# Patient Record
Sex: Female | Born: 1960 | Race: White | Hispanic: No | Marital: Married | Smoking: Former smoker
Health system: Southern US, Community
[De-identification: ages and names within clinical notes are randomized; demographics above are authoritative.]

## PROBLEM LIST (undated history)

## (undated) DIAGNOSIS — I2510 Atherosclerotic heart disease of native coronary artery without angina pectoris: Secondary | ICD-10-CM

## (undated) DIAGNOSIS — G43909 Migraine, unspecified, not intractable, without status migrainosus: Secondary | ICD-10-CM

## (undated) DIAGNOSIS — R569 Unspecified convulsions: Secondary | ICD-10-CM

## (undated) DIAGNOSIS — I10 Essential (primary) hypertension: Secondary | ICD-10-CM

## (undated) DIAGNOSIS — E785 Hyperlipidemia, unspecified: Secondary | ICD-10-CM

## (undated) HISTORY — PX: BARIATRIC SURGERY: SHX1103

---

## 2019-04-22 ENCOUNTER — Encounter: Payer: Self-pay | Admitting: Emergency Medicine

## 2019-04-22 ENCOUNTER — Other Ambulatory Visit: Payer: Self-pay

## 2019-04-22 ENCOUNTER — Emergency Department
Admission: EM | Admit: 2019-04-22 | Discharge: 2019-04-22 | Disposition: A | Discharge status: Home / Self Care | Payer: Medicare Other | Source: Home / Self Care

## 2019-04-22 DIAGNOSIS — H00025 Hordeolum internum left lower eyelid: Secondary | ICD-10-CM | POA: Diagnosis not present

## 2019-04-22 DIAGNOSIS — L03211 Cellulitis of face: Secondary | ICD-10-CM

## 2019-04-22 MED ORDER — ERYTHROMYCIN 5 MG/GM OP OINT: TOPICAL_OINTMENT | Freq: Four times a day (QID) | OPHTHALMIC | 0 refills | Status: DC

## 2019-04-22 MED ORDER — CEPHALEXIN 500 MG PO CAPS: 500.0000 mg | ORAL_CAPSULE | Freq: Two times a day (BID) | ORAL | 0 refills | Status: DC

## 2019-04-22 NOTE — ED Triage Notes (Signed)
Patient c/o left eye swelling under eye, very painful and redness.  No injury.

## 2019-04-22 NOTE — ED Provider Notes (Signed)
[facility]    CSN: [id number] Arrival date & time: 04/22/19  1009      History   Chief Complaint Chief Complaint  Patient presents with  . Facial Swelling    HPI Taylor Welch is a 58 y.o. female.   HPI Taylor Welch is a 58 y.o. female presenting to UC with c/o 2 days of worsening Left lower eyelid pain, redness and swelling that has progressed into Left cheek below her eye. She initially thought symptoms were due to a stye, she has not had  Stye in the past but tried some OTC "stye' medication w/o relief.  Associated frontal headache. Denies fever, chills, n/v/d. Denies ear pain or sore throat.    History reviewed. No pertinent past medical history.  There are no active problems to display for this patient.   History reviewed. No pertinent surgical history.  OB History   No obstetric history on file.      Home Medications    Prior to Admission medications   Medication Sig Start Date End Date Taking? Authorizing Provider  albuterol (PROAIR HFA) 108 (90 Base) MCG/ACT inhaler ProAir HFA 90 mcg/actuation aerosol inhaler  Inhale two puffs into the lungs every 6 (six) hours as needed for Wheezing. 08/02/17  Yes [provider]  Azilsartan Medoxomil 40 MG TABS Take by mouth. 04/11/19  Yes [provider]  carbamazepine (TEGRETOL XR) 200 MG 12 hr tablet carbamazepine ER 200 mg tablet,extended release,12 hr  Take 3 tablets (600 mg total) by mouth 2 times daily. As directed for seizure control 01/16/19  Yes [provider]  clopidogrel (PLAVIX) 75 MG tablet clopidogrel 75 mg tablet  TAKE ONE TABLET BY MOUTH EVERY DAY 03/28/19  Yes [provider]  cycloSPORINE (RESTASIS) 0.05 % ophthalmic emulsion Restasis 0.05 % eye drops in a dropperette   Yes [provider]  Diclofenac Potassium (CAMBIA) 50 MG PACK Cambia 50 mg oral powder packet  Take 1 packet by mouth as needed. For migraine 10/22/14  Yes [provider]   dicyclomine (BENTYL) 10 MG capsule dicyclomine 10 mg capsule   Yes [provider]  estradiol (ESTRACE) 0.1 MG/GM vaginal cream estradiol 0.01% (0.1 mg/gram) vaginal cream 10/29/16  Yes [provider]  ezetimibe (ZETIA) 10 MG tablet ezetimibe 10 mg tablet  TAKE 1 TABLET BY MOUTH AFTER BREAKFAST AS DIRECTED 03/17/19  Yes [provider]  fluticasone (FLONASE) 50 MCG/ACT nasal spray fluticasone propionate 50 mcg/actuation nasal spray,suspension 08/02/16  Yes [provider]  meclizine (ANTIVERT) 25 MG tablet meclizine 25 mg tablet  TAKE ONE TABLET BY MOUTH 3 TIMES A DAY AS NEEDED FOR DIZZINESS 01/13/18  Yes [provider]  mometasone-formoterol (DULERA) 200-5 MCG/ACT AERO Inhale into the lungs. 08/26/17  Yes [provider]  montelukast (SINGULAIR) 10 MG tablet montelukast 10 mg tablet  Take one tablet (10 mg dose) by mouth at bedtime. 09/02/17  Yes [provider]  omeprazole (PRILOSEC) 40 MG capsule Take one capsule by mouth night before surgery (between 9 and 11pm) then once daily 12/21/18  Yes [provider]  ondansetron (ZOFRAN-ODT) 4 MG disintegrating tablet ondansetron 4 mg disintegrating tablet 01/01/17  Yes [provider]  potassium chloride (K-DUR) 10 MEQ tablet potassium chloride ER 10 mEq tablet,extended release(part/cryst)  TAKE TWO TABLETS BY MOUTH 2 TIMES A DAY. 09/21/17  Yes [provider]  rosuvastatin (CRESTOR) 5 MG tablet rosuvastatin 5 mg tablet  TAKE ONE TABLET BY MOUTH EVERY DAY 11/01/13  Yes [provider]  tobramycin-dexamethasone (TOBRADEX) ophthalmic solution tobramycin 0.3 %-dexamethasone 0.1 % eye drops,suspension  Instill 1 drop into both eyes as directed   Yes [provider]  torsemide (DEMADEX) 20 MG tablet torsemide 20 mg tablet 11/17/13  Yes [provider]  cephALEXin (KEFLEX) 500 MG capsule Take 1 capsule (500 mg total) by mouth 2 (two) times daily.  04/22/19   Lurene Shadow, PA-C  erythromycin ophthalmic ointment Place into the left eye 4 (four) times daily. Place a 1/2 inch ribbon of ointment into the lower eyelid. 04/22/19   Lurene Shadow, PA-C    Family History No family history on file.  Social History Social History   Tobacco Use  . Smoking status: Former Games developer  . Smokeless tobacco: Never Used  Substance Use Topics  . Alcohol use: Not on file  . Drug use: Not on file     Allergies   Amoxicillin-pot clavulanate, Atorvastatin calcium, Fluocinolone, Nitrofurantoin macrocrystal, Propoxyphene, Simvastatin, Ciprofloxacin, Meperidine, and Doxycycline calcium   Review of Systems Review of Systems  Constitutional: Negative for chills and fever.  HENT: Positive for sinus pressure and sinus pain (Left maxillary). Negative for congestion and rhinorrhea.   Eyes: Positive for discharge and redness. Negative for photophobia, pain and visual disturbance.       Left lower eyelid  Skin: Positive for color change. Negative for wound.  Neurological: Positive for headaches. Negative for dizziness and light-headedness.     Physical Exam Triage Vital Signs ED Triage Vitals  Enc Vitals Group     BP 04/22/19 1036 (!) 178/89     Pulse Rate 04/22/19 1036 (!) 59     Resp --      Temp 04/22/19 1036 98.4 F (36.9 C)     Temp Source 04/22/19 1036 Oral     SpO2 04/22/19 1036 96 %     Weight 04/22/19 1037 192 lb (87.1 kg)     Height 04/22/19 1037 5' 5.5" (1.664 m)     Head Circumference --      Peak Flow --      Pain Score 04/22/19 1036 7     Pain Loc --      Pain Edu? --      Excl. in [place]? --    No data found.  Updated Vital Signs BP (!) 178/89 (BP Location: Right Arm)   Pulse (!) 59   Temp 98.4 F (36.9 C) (Oral)   Ht 5' 5.5" (1.664 m)   Wt 192 lb (87.1 kg)   SpO2 96%   BMI 31.46 kg/m   Physical Exam Vitals signs and nursing note reviewed.  Constitutional:      Appearance: She is well-developed.  HENT:     Head:  Normocephalic and atraumatic.      Nose: Nose normal.     Mouth/Throat:     Mouth: Mucous membranes are moist.  Eyes:     General: No scleral icterus.       Right eye: No discharge.        Left eye: No discharge.     Extraocular Movements: Extraocular movements intact.     Conjunctiva/sclera: Conjunctivae normal.     Pupils: Pupils are equal, round, and reactive to light.   Neck:     Musculoskeletal: Normal range of motion.  Cardiovascular:     Rate and Rhythm: Normal rate.  Pulmonary:     Effort: Pulmonary effort is normal.  Musculoskeletal: Normal range of motion.  Skin:  General: Skin is warm and dry.  Neurological:     Mental Status: She is alert and oriented to person, place, and time.  Psychiatric:        Behavior: Behavior normal.      UC Treatments / Results  Labs (all labs ordered are listed, but only abnormal results are displayed) Labs Reviewed - No data to display  EKG   Radiology No results found.  Procedures Procedures (including critical care time)  Medications Ordered in UC Medications - No data to display  Initial Impression / Assessment and Plan / UC Course  I have reviewed the triage vital signs and the nursing notes.  Pertinent labs & imaging results that were available during my care of the patient were reviewed by me and considered in my medical decision making (see chart for details).     Hx and exam c/w Left lower eyelid hordeolum with early cellulitis below Left eye. Pt has rash with PCNs but has had keflex in the past  AVS provided.  Final Clinical Impressions(s) / UC Diagnoses   Final diagnoses:  Hordeolum internum left lower eyelid  Cellulitis, face     Discharge Instructions      Please take antibiotics as prescribed and be sure to complete entire course even if you start to feel better to ensure infection does not come back.  Please follow up with a medical provider in 4-5 days if not improving, sooner if symptoms  continue to worsen despite antibiotics.      ED Prescriptions    Medication Sig Dispense Auth. Provider   cephALEXin (KEFLEX) 500 MG capsule Take 1 capsule (500 mg total) by mouth 2 (two) times daily. 14 capsule Leeroy Cha O, PA-C   erythromycin ophthalmic ointment Place into the left eye 4 (four) times daily. Place a 1/2 inch ribbon of ointment into the lower eyelid. 3.5 g Noe Gens, PA-C     PDMP not reviewed this encounter.   Noe Gens, PA-C 04/22/19 1106

## 2019-04-22 NOTE — Discharge Instructions (Signed)
°  Please take antibiotics as prescribed and be sure to complete entire course even if you start to feel better to ensure infection does not come back.  Please follow up with a medical provider in 4-5 days if not improving, sooner if symptoms continue to worsen despite antibiotics.

## 2019-08-03 ENCOUNTER — Emergency Department
Admission: EM | Admit: 2019-08-03 | Discharge: 2019-08-03 | Disposition: A | Discharge status: Home / Self Care | Payer: Medicare Other | Source: Home / Self Care

## 2019-08-03 ENCOUNTER — Other Ambulatory Visit: Payer: Self-pay

## 2019-08-03 ENCOUNTER — Encounter: Payer: Self-pay | Admitting: Emergency Medicine

## 2019-08-03 DIAGNOSIS — R300 Dysuria: Secondary | ICD-10-CM

## 2019-08-03 HISTORY — DX: Unspecified convulsions: R56.9

## 2019-08-03 HISTORY — DX: Migraine, unspecified, not intractable, without status migrainosus: G43.909

## 2019-08-03 HISTORY — DX: Hyperlipidemia, unspecified: E78.5

## 2019-08-03 HISTORY — DX: Essential (primary) hypertension: I10

## 2019-08-03 HISTORY — DX: Atherosclerotic heart disease of native coronary artery without angina pectoris: I25.10

## 2019-08-03 LAB — POCT URINALYSIS DIP (MANUAL ENTRY)
Bilirubin, UA: NEGATIVE
Blood, UA: NEGATIVE
Glucose, UA: NEGATIVE mg/dL
Ketones, POC UA: NEGATIVE mg/dL
Nitrite, UA: NEGATIVE
Protein Ur, POC: NEGATIVE mg/dL
Spec Grav, UA: 1.015
Urobilinogen, UA: 0.2 U/dL
pH, UA: 6.5

## 2019-08-03 MED ORDER — CEPHALEXIN 500 MG PO CAPS: 500.0000 mg | ORAL_CAPSULE | Freq: Four times a day (QID) | ORAL | 0 refills | Status: AC

## 2019-08-03 NOTE — Discharge Instructions (Addendum)
Return if any problems.

## 2019-08-03 NOTE — ED Triage Notes (Signed)
Dysuria x 2 days.

## 2019-08-07 LAB — UNLABELED: Test Ordered On Req: 395

## 2019-08-07 NOTE — ED Provider Notes (Signed)
[facility]    CSN: [id number] Arrival date & time: 08/03/19  1205      History   Chief Complaint Chief Complaint  Patient presents with  . Dysuria    HPI Taylor Welch is a 59 y.o. female.   The history is provided by the patient. No language interpreter was used.  Dysuria Pain quality:  Aching Pain severity:  Moderate Onset quality:  Gradual Timing:  Constant Progression:  Worsening Chronicity:  New Recent urinary tract infections: no   Relieved by:  Nothing Worsened by:  Nothing Ineffective treatments:  None tried Risk factors: no hx of pyelonephritis     Past Medical History:  Diagnosis Date  . Coronary artery disease   . Hyperlipemia   . Hypertension   . Migraines   . Seizures (HCC)     There are no problems to display for this patient.   Past Surgical History:  Procedure Laterality Date  . BARIATRIC SURGERY      OB History   No obstetric history on file.      Home Medications    Prior to Admission medications   Medication Sig Start Date End Date Taking? Authorizing Provider  albuterol (PROAIR HFA) 108 (90 Base) MCG/ACT inhaler ProAir HFA 90 mcg/actuation aerosol inhaler  Inhale two puffs into the lungs every 6 (six) hours as needed for Wheezing. 08/02/17   [provider]  carbamazepine (TEGRETOL XR) 200 MG 12 hr tablet carbamazepine ER 200 mg tablet,extended release,12 hr  Take 3 tablets (600 mg total) by mouth 2 times daily. As directed for seizure control 01/16/19   [provider]  cephALEXin (KEFLEX) 500 MG capsule Take 1 capsule (500 mg total) by mouth 4 (four) times daily for 7 days. 08/03/19 08/10/19  Elson Areas, PA-C  clopidogrel (PLAVIX) 75 MG tablet clopidogrel 75 mg tablet  TAKE ONE TABLET BY MOUTH EVERY DAY 03/28/19   [provider]  cycloSPORINE (RESTASIS) 0.05 % ophthalmic emulsion Restasis 0.05 % eye drops in a dropperette    [provider]  Diclofenac Potassium (CAMBIA) 50 MG  PACK Cambia 50 mg oral powder packet  Take 1 packet by mouth as needed. For migraine 10/22/14   [provider]  dicyclomine (BENTYL) 10 MG capsule dicyclomine 10 mg capsule    [provider]  erythromycin ophthalmic ointment Place into the left eye 4 (four) times daily. Place a 1/2 inch ribbon of ointment into the lower eyelid. 04/22/19   Lurene Shadow, PA-C  estradiol (ESTRACE) 0.1 MG/GM vaginal cream estradiol 0.01% (0.1 mg/gram) vaginal cream 10/29/16   [provider]  ezetimibe (ZETIA) 10 MG tablet ezetimibe 10 mg tablet  TAKE 1 TABLET BY MOUTH AFTER BREAKFAST AS DIRECTED 03/17/19   [provider]  fluticasone (FLONASE) 50 MCG/ACT nasal spray fluticasone propionate 50 mcg/actuation nasal spray,suspension 08/02/16   [provider]  meclizine (ANTIVERT) 25 MG tablet meclizine 25 mg tablet  TAKE ONE TABLET BY MOUTH 3 TIMES A DAY AS NEEDED FOR DIZZINESS 01/13/18   [provider]  mometasone-formoterol (DULERA) 200-5 MCG/ACT AERO Inhale into the lungs. 08/26/17   [provider]  montelukast (SINGULAIR) 10 MG tablet montelukast 10 mg tablet  Take one tablet (10 mg dose) by mouth at bedtime. 09/02/17   [provider]  omeprazole (PRILOSEC) 40 MG capsule Take one capsule by mouth night before surgery (between 9 and 11pm) then once daily 12/21/18   [provider]  ondansetron (ZOFRAN-ODT) 4 MG disintegrating  tablet ondansetron 4 mg disintegrating tablet 01/01/17   [provider]  rosuvastatin (CRESTOR) 5 MG tablet rosuvastatin 5 mg tablet  TAKE ONE TABLET BY MOUTH EVERY DAY 11/01/13   [provider]  tobramycin-dexamethasone (TOBRADEX) ophthalmic solution tobramycin 0.3 %-dexamethasone 0.1 % eye drops,suspension  Instill 1 drop into both eyes as directed    [provider]  torsemide (DEMADEX) 20 MG tablet torsemide 20 mg tablet 11/17/13   [provider]    Family History Family  History  Problem Relation Age of Onset  . Heart failure Mother   . Hypertension Mother   . Heart failure Father     Social History Social History   Tobacco Use  . Smoking status: Former Smoker    Quit date: 08/02/2012    Years since quitting: 7.0  . Smokeless tobacco: Never Used  Substance Use Topics  . Alcohol use: Yes  . Drug use: Not on file     Allergies   Amoxicillin-pot clavulanate, Atorvastatin calcium, Fluocinolone, Nitrofurantoin macrocrystal, Propoxyphene, Simvastatin, Ciprofloxacin, Meperidine, and Doxycycline calcium   Review of Systems Review of Systems  Genitourinary: Positive for dysuria.  All other systems reviewed and are negative.    Physical Exam Triage Vital Signs ED Triage Vitals  Enc Vitals Group     BP 08/03/19 1255 (!) 146/79     Pulse Rate 08/03/19 1255 70     Resp --      Temp 08/03/19 1255 98.6 F (37 C)     Temp Source 08/03/19 1255 Oral     SpO2 08/03/19 1255 98 %     Weight 08/03/19 1256 185 lb (83.9 kg)     Height 08/03/19 1256 5\' 5"  (1.651 m)     Head Circumference --      Peak Flow --      Pain Score 08/03/19 1256 7     Pain Loc --      Pain Edu? --      Excl. in [place]? --    No data found.  Updated Vital Signs BP (!) 146/79 (BP Location: Right Arm)   Pulse 70   Temp 98.6 F (37 C) (Oral)   Ht 5\' 5"  (1.651 m)   Wt 83.9 kg   SpO2 98%   BMI 30.79 kg/m   Visual Acuity Right Eye Distance:   Left Eye Distance:   Bilateral Distance:    Right Eye Near:   Left Eye Near:    Bilateral Near:     Physical Exam Vitals and nursing note reviewed.  Constitutional:      Appearance: She is well-developed.  HENT:     Head: Normocephalic.  Cardiovascular:     Rate and Rhythm: Normal rate.  Pulmonary:     Effort: Pulmonary effort is normal.  Abdominal:     General: There is no distension.  Musculoskeletal:        General: Normal range of motion.     Cervical back: Normal range of motion.  Neurological:     Mental  Status: She is alert and oriented to person, place, and time.      UC Treatments / Results  Labs (all labs ordered are listed, but only abnormal results are displayed) Labs Reviewed  POCT URINALYSIS DIP (MANUAL ENTRY) - Abnormal; Notable for the following components:      Result Value   Leukocytes, UA Trace (*)    All other components within normal limits  URINE CULTURE    EKG  Radiology No results found.  Procedures Procedures (including critical care time)  Medications Ordered in UC Medications - No data to display  Initial Impression / Assessment and Plan / UC Course  I have reviewed the triage vital signs and the nursing notes.  Pertinent labs & imaging results that were available during my care of the patient were reviewed by me and considered in my medical decision making (see chart for details).     MDM  I will culture urine.  Pt given rx for keflex.  Final Clinical Impressions(s) / UC Diagnoses   Final diagnoses:  Dysuria     Discharge Instructions     Return if any problems.   ED Prescriptions    Medication Sig Dispense Auth. Provider   cephALEXin (KEFLEX) 500 MG capsule Take 1 capsule (500 mg total) by mouth 4 (four) times daily for 7 days. 28 capsule Elson Areas, [place]     PDMP not reviewed this encounter.  An After Visit Summary was printed and given to the patient.    Elson Areas, [place] 08/07/19 1020

## 2019-08-09 LAB — PAT ID TIQ DOC: Test Affected: 395

## 2019-08-09 LAB — URINE CULTURE
MICRO NUMBER:: 1245210
SPECIMEN QUALITY:: ADEQUATE

## 2020-11-09 ENCOUNTER — Other Ambulatory Visit: Payer: Self-pay

## 2020-11-09 ENCOUNTER — Emergency Department (INDEPENDENT_AMBULATORY_CARE_PROVIDER_SITE_OTHER): Payer: Medicare Other

## 2020-11-09 ENCOUNTER — Emergency Department
Admission: EM | Admit: 2020-11-09 | Discharge: 2020-11-09 | Disposition: A | Discharge status: Home / Self Care | Payer: Medicare Other | Source: Home / Self Care | Attending: Family Medicine | Admitting: Family Medicine

## 2020-11-09 DIAGNOSIS — M79672 Pain in left foot: Secondary | ICD-10-CM | POA: Diagnosis not present

## 2020-11-09 DIAGNOSIS — M7742 Metatarsalgia, left foot: Secondary | ICD-10-CM

## 2020-11-09 DIAGNOSIS — M67972 Unspecified disorder of synovium and tendon, left ankle and foot: Secondary | ICD-10-CM

## 2020-11-09 NOTE — Discharge Instructions (Signed)
Wear boot at all times that you are walking May try ice and elevation to reduce swelling and pain Take Tylenol for pain See podiatrist in May

## 2020-11-09 NOTE — ED Provider Notes (Signed)
[facility]    CSN: [id number] Arrival date & time: 11/09/20  1344      History   Chief Complaint Chief Complaint  Patient presents with  . Foot Pain    Left    HPI Taylor Welch is a 60 y.o. female.   HPI  Patient is here for foot pain.  She is having significant pain in her left foot.  It is making painful for her to walk.  She states she is walking with her foot rotated outward and putting all of her weight on her heel.  This is not comfortable either, she has chronic plantar fasciitis and heel pain.  She was referred to podiatry by her usual physician.  The appointment is not until early May.  Patient does not feel like she can wait this long for pain relief.  Current pain is across the toes and distal foot.  It is swollen.  She has not taken medicine for the pain. She has known vascular disease. Previously was a smoker but is not now. Is on medicine controlling her hypertension.  Past Medical History:  Diagnosis Date  . Coronary artery disease   . Hyperlipemia   . Hypertension   . Migraines   . Seizures (HCC)     There are no problems to display for this patient.   Past Surgical History:  Procedure Laterality Date  . BARIATRIC SURGERY      OB History   No obstetric history on file.      Home Medications    Prior to Admission medications   Medication Sig Start Date End Date Taking? Authorizing Provider  amlodipine-atorvastatin (CADUET) 10-10 MG tablet Take 1 tablet by mouth daily.   Yes [provider]  Azilsartan Medoxomil 80 MG TABS Take by mouth.   Yes [provider]  DULoxetine (CYMBALTA) 60 MG capsule Take 1 capsule by mouth daily. 09/20/13  Yes [provider]  omeprazole (PRILOSEC) 40 MG capsule Take by mouth. 02/02/20  Yes [provider]  torsemide (DEMADEX) 20 MG tablet Take by mouth. 04/01/20 11/09/20 Yes [provider]  albuterol (PROAIR HFA) 108 (90 Base) MCG/ACT inhaler ProAir HFA 90  mcg/actuation aerosol inhaler  Inhale two puffs into the lungs every 6 (six) hours as needed for Wheezing. 08/02/17   [provider]  carbamazepine (TEGRETOL XR) 200 MG 12 hr tablet carbamazepine ER 200 mg tablet,extended release,12 hr  Take 3 tablets (600 mg total) by mouth 2 times daily. As directed for seizure control 01/16/19   [provider]  clopidogrel (PLAVIX) 75 MG tablet clopidogrel 75 mg tablet  TAKE ONE TABLET BY MOUTH EVERY DAY 03/28/19   [provider]  cycloSPORINE (RESTASIS) 0.05 % ophthalmic emulsion Restasis 0.05 % eye drops in a dropperette    [provider]  dicyclomine (BENTYL) 10 MG capsule dicyclomine 10 mg capsule    [provider]  estradiol (ESTRACE) 0.1 MG/GM vaginal cream estradiol 0.01% (0.1 mg/gram) vaginal cream 10/29/16   [provider]  ezetimibe (ZETIA) 10 MG tablet ezetimibe 10 mg tablet  TAKE 1 TABLET BY MOUTH AFTER BREAKFAST AS DIRECTED 03/17/19   [provider]  fluticasone (FLONASE) 50 MCG/ACT nasal spray fluticasone propionate 50 mcg/actuation nasal spray,suspension 08/02/16   [provider]  meclizine (ANTIVERT) 25 MG tablet meclizine 25 mg tablet  TAKE ONE TABLET BY MOUTH 3 TIMES A DAY AS NEEDED FOR DIZZINESS 01/13/18   [provider]  mometasone-formoterol (DULERA) 200-5 MCG/ACT AERO Inhale  into the lungs. 08/26/17   [provider]  montelukast (SINGULAIR) 10 MG tablet montelukast 10 mg tablet  Take one tablet (10 mg dose) by mouth at bedtime. 09/02/17   [provider]  ondansetron (ZOFRAN-ODT) 4 MG disintegrating tablet ondansetron 4 mg disintegrating tablet 01/01/17   [provider]  rosuvastatin (CRESTOR) 5 MG tablet rosuvastatin 5 mg tablet  TAKE ONE TABLET BY MOUTH EVERY DAY 11/01/13   [provider]  tobramycin-dexamethasone (TOBRADEX) ophthalmic solution tobramycin 0.3 %-dexamethasone 0.1 % eye drops,suspension  Instill 1 drop  into both eyes as directed    [provider]    Family History Family History  Problem Relation Age of Onset  . Heart failure Mother   . Hypertension Mother   . Heart failure Father     Social History Social History   Tobacco Use  . Smoking status: Former Smoker    Quit date: 08/02/2012    Years since quitting: 8.2  . Smokeless tobacco: Never Used  Vaping Use  . Vaping Use: Former  Substance Use Topics  . Alcohol use: Not Currently  . Drug use: Not Currently     Allergies   Amoxicillin-pot clavulanate, Atorvastatin calcium, Fluocinolone, Nitrofurantoin macrocrystal, Propoxyphene, Simvastatin, Ciprofloxacin, Meperidine, and Doxycycline calcium   Review of Systems Review of Systems See HPI  Physical Exam Triage Vital Signs ED Triage Vitals  Enc Vitals Group     BP 11/09/20 1400 (!) 148/83     Pulse Rate 11/09/20 1400 65     Resp 11/09/20 1400 20     Temp 11/09/20 1400 98.4 F (36.9 C)     Temp Source 11/09/20 1400 Oral     SpO2 11/09/20 1400 98 %     Weight 11/09/20 1358 185 lb (83.9 kg)     Height 11/09/20 1358 5' 5.5" (1.664 m)     Head Circumference --      Peak Flow --      Pain Score 11/09/20 1358 7     Pain Loc --      Pain Edu? --      Excl. in [place]? --    No data found.  Updated Vital Signs BP (!) 148/83 (BP Location: Right Arm)   Pulse 65   Temp 98.4 F (36.9 C) (Oral)   Resp 20   Ht 5' 5.5" (1.664 m)   Wt 83.9 kg   SpO2 98%   BMI 30.32 kg/m        Physical Exam Constitutional:      General: She is not in acute distress.    Appearance: She is well-developed.  HENT:     Head: Normocephalic and atraumatic.     Mouth/Throat:     Comments: Mask in place Eyes:     Conjunctiva/sclera: Conjunctivae normal.     Pupils: Pupils are equal, round, and reactive to light.  Cardiovascular:     Rate and Rhythm: Normal rate.  Pulmonary:     Effort: Pulmonary effort is normal. No respiratory distress.  Abdominal:     General: There  is no distension.     Palpations: Abdomen is soft.  Musculoskeletal:        General: Normal range of motion.     Cervical back: Normal range of motion.     Comments: Left foot with distal swelling, mild, over the distal metatarsals 2-3 4.  Pain with movement of these toes.  Tender over the MCP joints  Skin:    General: Skin is  warm and dry.  Neurological:     Mental Status: She is alert.     Gait: Gait abnormal.  Psychiatric:        Behavior: Behavior normal.      UC Treatments / Results  Labs (all labs ordered are listed, but only abnormal results are displayed) Labs Reviewed - No data to display  EKG   Radiology DG Foot Complete Left  Result Date: 11/09/2020 CLINICAL DATA:  Metatarsal pain G, knee injury pain prostate 80 EXAM: LEFT FOOT - COMPLETE 3+ VIEW COMPARISON:  None. FINDINGS: There is no evidence of fracture or dislocation. There is no evidence of arthropathy or other focal bone abnormality. Soft tissues are unremarkable. IMPRESSION: No fracture or dislocation of the left foot. Electronically Signed   By: Lauralyn Primes M.D.   On: 11/09/2020 15:08    Procedures Procedures (including critical care time)  Medications Ordered in UC Medications - No data to display  Initial Impression / Assessment and Plan / UC Course  I have reviewed the triage vital signs and the nursing notes.  Pertinent labs & imaging results that were available during my care of the patient were reviewed by me and considered in my medical decision making (see chart for details).    I consider that patient could have metatarsal stress fracture.  I discussed with her that these are notoriously difficult to see on imaging, especially when new.  Will immobilize.  She does have an appointment with podiatry.  Pain management discussed  Final Clinical Impressions(s) / UC Diagnoses   Final diagnoses:  Metatarsalgia of left foot  Foot pain, left  Achilles tendon disorder, left     Discharge  Instructions     Wear boot at all times that you are walking May try ice and elevation to reduce swelling and pain Take Tylenol for pain See podiatrist in May    ED Prescriptions    None     PDMP not reviewed this encounter.   Eustace Moore, MD 11/10/20 [phone]

## 2020-11-09 NOTE — ED Triage Notes (Signed)
Pt presents to Urgent Care with c/o L foot pain x 2 weeks. She does not recall a specific injury; normally walks 3-4 miles per day. Reports a throbbing pain across base of toes and in toes of L foot. Swelling noted to area.

## 2020-12-27 ENCOUNTER — Emergency Department
Admission: EM | Admit: 2020-12-27 | Discharge: 2020-12-27 | Disposition: A | Discharge status: Home / Self Care | Payer: Medicare Other | Source: Home / Self Care

## 2020-12-27 ENCOUNTER — Other Ambulatory Visit: Payer: Self-pay

## 2020-12-27 DIAGNOSIS — L237 Allergic contact dermatitis due to plants, except food: Secondary | ICD-10-CM

## 2020-12-27 DIAGNOSIS — L298 Other pruritus: Secondary | ICD-10-CM

## 2020-12-27 DIAGNOSIS — L259 Unspecified contact dermatitis, unspecified cause: Secondary | ICD-10-CM | POA: Diagnosis not present

## 2020-12-27 MED ORDER — PREDNISONE 20 MG PO TABS: ORAL_TABLET | ORAL | 0 refills | Status: DC

## 2020-12-27 MED ORDER — METHYLPREDNISOLONE ACETATE 80 MG/ML IJ SUSP: 80.0000 mg | Freq: Once | INTRAMUSCULAR | Status: DC

## 2020-12-27 NOTE — ED Triage Notes (Addendum)
Pt c/o rash on upper chest as well as back of neck x 2 days. Some spots on upper arms. Itchy. No OTC creams tried. Benedryl prn.

## 2020-12-27 NOTE — Discharge Instructions (Signed)
Advised/instructed patient to start oral prednisone taper tomorrow Saturday, 12/28/2020.  Advised patient to take medication as directed with food to completion.  Encouraged patient increase daily water intake while taking medication.  Advised/encouraged patient to change bed linens for the next 3 days to avoid recontamination.

## 2020-12-27 NOTE — ED Provider Notes (Signed)
[facility]    CSN: [id number] Arrival date & time: 12/27/20  1351      History   Chief Complaint Chief Complaint  Patient presents with  . Rash    HPI Taylor Welch is a 60 y.o. female.   HPI 60 year old female presents with rash of face, upper chest, lower abdomen, posterior neck, and upper arms bilaterally for 3 days.  Patient's chart reveals Fluocinolone allergy; however, patient reports taking steroid injections in the past without adverse side effects and oral prednisone without adverse side effects.  Past Medical History:  Diagnosis Date  . Coronary artery disease   . Hyperlipemia   . Hypertension   . Migraines   . Seizures (HCC)     There are no problems to display for this patient.   Past Surgical History:  Procedure Laterality Date  . BARIATRIC SURGERY      OB History   No obstetric history on file.      Home Medications    Prior to Admission medications   Medication Sig Start Date End Date Taking? Authorizing Provider  predniSONE (DELTASONE) 20 MG tablet Take 3 tabs PO daily x 3 days, then 2 tabs PO daily x 3 days, then 1 tab PO daily x 3 days 12/27/20  Yes Trevor Iha, FNP  albuterol (PROAIR HFA) 108 (90 Base) MCG/ACT inhaler ProAir HFA 90 mcg/actuation aerosol inhaler  Inhale two puffs into the lungs every 6 (six) hours as needed for Wheezing. 08/02/17   [provider]  amlodipine-atorvastatin (CADUET) 10-10 MG tablet Take 1 tablet by mouth daily.    [provider]  Azilsartan Medoxomil 80 MG TABS Take by mouth.    [provider]  carbamazepine (TEGRETOL XR) 200 MG 12 hr tablet carbamazepine ER 200 mg tablet,extended release,12 hr  Take 3 tablets (600 mg total) by mouth 2 times daily. As directed for seizure control 01/16/19   [provider]  clopidogrel (PLAVIX) 75 MG tablet clopidogrel 75 mg tablet  TAKE ONE TABLET BY MOUTH EVERY DAY 03/28/19   [provider]  cycloSPORINE (RESTASIS)  0.05 % ophthalmic emulsion Restasis 0.05 % eye drops in a dropperette    [provider]  dicyclomine (BENTYL) 10 MG capsule dicyclomine 10 mg capsule    [provider]  DULoxetine (CYMBALTA) 60 MG capsule Take 1 capsule by mouth daily. 09/20/13   [provider]  estradiol (ESTRACE) 0.1 MG/GM vaginal cream estradiol 0.01% (0.1 mg/gram) vaginal cream 10/29/16   [provider]  ezetimibe (ZETIA) 10 MG tablet ezetimibe 10 mg tablet  TAKE 1 TABLET BY MOUTH AFTER BREAKFAST AS DIRECTED 03/17/19   [provider]  fluticasone (FLONASE) 50 MCG/ACT nasal spray fluticasone propionate 50 mcg/actuation nasal spray,suspension 08/02/16   [provider]  meclizine (ANTIVERT) 25 MG tablet meclizine 25 mg tablet  TAKE ONE TABLET BY MOUTH 3 TIMES A DAY AS NEEDED FOR DIZZINESS 01/13/18   [provider]  mometasone-formoterol (DULERA) 200-5 MCG/ACT AERO Inhale into the lungs. 08/26/17   [provider]  montelukast (SINGULAIR) 10 MG tablet montelukast 10 mg tablet  Take one tablet (10 mg dose) by mouth at bedtime. 09/02/17   [provider]  omeprazole (PRILOSEC) 40 MG capsule Take by mouth. 02/02/20   [provider]  ondansetron (ZOFRAN-ODT) 4 MG disintegrating tablet ondansetron 4 mg disintegrating tablet 01/01/17   [provider]  rosuvastatin (CRESTOR) 5 MG tablet rosuvastatin 5 mg tablet  TAKE ONE TABLET BY MOUTH EVERY DAY  11/01/13   [provider]  tobramycin-dexamethasone (TOBRADEX) ophthalmic solution tobramycin 0.3 %-dexamethasone 0.1 % eye drops,suspension  Instill 1 drop into both eyes as directed    [provider]  torsemide (DEMADEX) 20 MG tablet Take by mouth. 04/01/20 11/09/20  [provider]    Family History Family History  Problem Relation Age of Onset  . Heart failure Mother   . Hypertension Mother   . Heart failure Father     Social History Social History   Tobacco  Use  . Smoking status: Former Smoker    Quit date: 08/02/2012    Years since quitting: 8.4  . Smokeless tobacco: Never Used  Vaping Use  . Vaping Use: Former  Substance Use Topics  . Alcohol use: Not Currently  . Drug use: Not Currently     Allergies   Amoxicillin-pot clavulanate, Atorvastatin calcium, Fluocinolone, Nitrofurantoin macrocrystal, Propoxyphene, Simvastatin, Ciprofloxacin, Meperidine, and Doxycycline calcium   Review of Systems Review of Systems  Constitutional: Negative.   HENT: Negative.   Eyes: Negative.   Respiratory: Negative.   Cardiovascular: Negative.   Gastrointestinal: Negative.   Genitourinary: Negative.   Musculoskeletal: Negative.   Skin: Positive for rash.  Neurological: Negative.      Physical Exam Triage Vital Signs ED Triage Vitals  Enc Vitals Group     BP 12/27/20 1405 (!) 147/77     Pulse Rate 12/27/20 1405 63     Resp 12/27/20 1405 18     Temp 12/27/20 1405 98.2 F (36.8 C)     Temp Source 12/27/20 1405 Oral     SpO2 12/27/20 1405 98 %     Weight --      Height --      Head Circumference --      Peak Flow --      Pain Score 12/27/20 1408 0     Pain Loc --      Pain Edu? --      Excl. in GC? --    No data found.  Updated Vital Signs BP (!) 147/77 (BP Location: Right Arm)   Pulse 63   Temp 98.2 F (36.8 C) (Oral)   Resp 18   SpO2 98%      Physical Exam Vitals and nursing note reviewed.  Constitutional:      General: She is not in acute distress.    Appearance: Normal appearance. She is normal weight. She is not ill-appearing.  HENT:     Head: Normocephalic and atraumatic.     Nose: Nose normal.     Mouth/Throat:     Mouth: Mucous membranes are moist.     Pharynx: Oropharynx is clear.  Eyes:     Extraocular Movements: Extraocular movements intact.     Conjunctiva/sclera: Conjunctivae normal.     Pupils: Pupils are equal, round, and reactive to light.  Cardiovascular:     Rate and Rhythm: Normal rate and  regular rhythm.     Pulses: Normal pulses.     Heart sounds: Normal heart sounds.  Pulmonary:     Effort: Pulmonary effort is normal. No respiratory distress.     Breath sounds: Normal breath sounds. No wheezing, rhonchi or rales.  Musculoskeletal:     Cervical back: Normal range of motion and neck supple.  Skin:    Comments: Face (left cheek) /posterior inferior aspect of neck/upper chest bilaterally, left lower abdomen, upper arm (volar aspects): Diffuse scattered maculopapular eruption, multiple grouped linear vesicular lesions, pruritic in nature  Neurological:     General: No focal deficit present.     Mental Status: She is alert and oriented to person, place, and time.  Psychiatric:        Mood and Affect: Mood normal.        Behavior: Behavior normal.      UC Treatments / Results  Labs (all labs ordered are listed, but only abnormal results are displayed) Labs Reviewed - No data to display  EKG   Radiology No results found.  Procedures Procedures (including critical care time)  Medications Ordered in UC Medications  methylPREDNISolone acetate (DEPO-MEDROL) injection 80 mg (has no administration in time range)    Initial Impression / Assessment and Plan / UC Course  I have reviewed the triage vital signs and the nursing notes.  Pertinent labs & imaging results that were available during my care of the patient were reviewed by me and considered in my medical decision making (see chart for details).     MDM: 1.  Poison ivy dermatitis, 2.  Pruritic erythematous rash, 3. Contact dermatitis.  Patient reports taking prednisone in the past as well as IM injections of prednisone without adverse reaction.  Patient discharged home, hemodynamically stable. Final Clinical Impressions(s) / UC Diagnoses   Final diagnoses:  Contact dermatitis, unspecified contact dermatitis type, unspecified trigger  Poison ivy dermatitis  Pruritic erythematous rash     Discharge  Instructions     Advised/instructed patient to start oral prednisone taper tomorrow Saturday, 12/28/2020.  Advised patient to take medication as directed with food to completion.  Encouraged patient increase daily water intake while taking medication.  Advised/encouraged patient to change bed linens for the next 3 days to avoid recontamination.    ED Prescriptions    Medication Sig Dispense Auth. Provider   predniSONE (DELTASONE) 20 MG tablet Take 3 tabs PO daily x 3 days, then 2 tabs PO daily x 3 days, then 1 tab PO daily x 3 days 18 tablet Trevor Iha, FNP     PDMP not reviewed this encounter.   Trevor Iha, FNP 12/27/20 [phone]

## 2021-02-06 ENCOUNTER — Telehealth: Payer: Self-pay

## 2021-02-06 ENCOUNTER — Ambulatory Visit: Payer: Medicare Other | Admitting: Obstetrics and Gynecology

## 2021-02-06 NOTE — Telephone Encounter (Signed)
Pt scheduled with Donavan Foil, MD for LEEP today; per chart review, no recorded need for this procedure. History significant for hysterectomy and removal of ovaries. Called pt to follow up. Pt reports she was not told purpose of appt, has no GYN related concerns. States she has a PCP, OB/GYN, and multiple other providers. Pt would like to cancel appt today. Encouraged pt to follow up with any future needs.

## 2021-02-28 ENCOUNTER — Emergency Department
Admission: EM | Admit: 2021-02-28 | Discharge: 2021-02-28 | Discharge status: Against Medical Advice | Payer: Medicare Other

## 2021-09-29 ENCOUNTER — Other Ambulatory Visit: Payer: Self-pay

## 2021-09-29 ENCOUNTER — Emergency Department
Admission: EM | Admit: 2021-09-29 | Discharge: 2021-09-29 | Disposition: A | Discharge status: Home / Self Care | Payer: Medicare Other | Source: Home / Self Care | Attending: Family Medicine | Admitting: Family Medicine

## 2021-09-29 ENCOUNTER — Emergency Department (INDEPENDENT_AMBULATORY_CARE_PROVIDER_SITE_OTHER): Payer: Medicare Other

## 2021-09-29 DIAGNOSIS — W19XXXA Unspecified fall, initial encounter: Secondary | ICD-10-CM | POA: Diagnosis not present

## 2021-09-29 DIAGNOSIS — S20212A Contusion of left front wall of thorax, initial encounter: Secondary | ICD-10-CM

## 2021-09-29 DIAGNOSIS — R0781 Pleurodynia: Secondary | ICD-10-CM | POA: Diagnosis not present

## 2021-09-29 DIAGNOSIS — Y92009 Unspecified place in unspecified non-institutional (private) residence as the place of occurrence of the external cause: Secondary | ICD-10-CM | POA: Diagnosis not present

## 2021-09-29 MED ORDER — PREDNISONE 20 MG PO TABS: 40.0000 mg | ORAL_TABLET | Freq: Every day | ORAL | 0 refills | Status: DC

## 2021-09-29 MED ORDER — HYDROCODONE-ACETAMINOPHEN 5-325 MG PO TABS: 1.0000 | ORAL_TABLET | Freq: Four times a day (QID) | ORAL | 0 refills | Status: DC | PRN

## 2021-09-29 NOTE — Discharge Instructions (Signed)
Be sure to take deep breaths and hold a pillow against your side to keep from having as much pain with coughing, sneezing, movement Take Tylenol for moderate pain.  May take hydrocodone for severe pain.  Do not drive on hydrocodone I have given you a prescription for prednisone to take for the next few days.  This will take down inflammation and pain in the rib cage See your doctor in follow-up

## 2021-09-29 NOTE — ED Triage Notes (Signed)
Pt states that she fell and injured the left side of her ribs. X5 days

## 2021-09-29 NOTE — ED Provider Notes (Signed)
[facility]    CSN: [id number] Arrival date & time: 09/29/21  1406      History   Chief Complaint Chief Complaint  Patient presents with   Rib Injury    X5 days  Left rib pain    HPI Taylor Welch is a 61 y.o. female.   HPI  Patient gives a history of falling in her home 5 days ago.  She has pain in her left ribs.  Pain with deep breath and with certain movements.  No coughing, sputum, or fever.  She was leaning over the edge of the bathtub to bathe her dog and her hand slipped, causing her to hit her ribs forcibly on the side of the tub.  It was immediately painful.  She felt a pop She has a history of heart disease, hypertension, hyperlipidemia.   Past Medical History:  Diagnosis Date   Coronary artery disease    Hyperlipemia    Hypertension    Migraines    Seizures (HCC)     There are no problems to display for this patient.   Past Surgical History:  Procedure Laterality Date   BARIATRIC SURGERY      OB History   No obstetric history on file.      Home Medications    Prior to Admission medications   Medication Sig Start Date End Date Taking? Authorizing Provider  amlodipine-atorvastatin (CADUET) 10-10 MG tablet Take 1 tablet by mouth daily.   Yes [provider]  Azilsartan Medoxomil 80 MG TABS Take by mouth.   Yes [provider]  carbamazepine (TEGRETOL XR) 200 MG 12 hr tablet carbamazepine ER 200 mg tablet,extended release,12 hr  Take 3 tablets (600 mg total) by mouth 2 times daily. As directed for seizure control 01/16/19  Yes [provider]  clopidogrel (PLAVIX) 75 MG tablet clopidogrel 75 mg tablet  TAKE ONE TABLET BY MOUTH EVERY DAY 03/28/19  Yes [provider]  cycloSPORINE (RESTASIS) 0.05 % ophthalmic emulsion Restasis 0.05 % eye drops in a dropperette   Yes [provider]  dicyclomine (BENTYL) 10 MG capsule dicyclomine 10 mg capsule   Yes [provider]  DULoxetine (CYMBALTA)  60 MG capsule Take 1 capsule by mouth daily. 09/20/13  Yes [provider]  estradiol (ESTRACE) 0.1 MG/GM vaginal cream estradiol 0.01% (0.1 mg/gram) vaginal cream 10/29/16  Yes [provider]  ezetimibe (ZETIA) 10 MG tablet ezetimibe 10 mg tablet  TAKE 1 TABLET BY MOUTH AFTER BREAKFAST AS DIRECTED 03/17/19  Yes [provider]  fluticasone (FLONASE) 50 MCG/ACT nasal spray fluticasone propionate 50 mcg/actuation nasal spray,suspension 08/02/16  Yes [provider]  HYDROcodone-acetaminophen (NORCO/VICODIN) 5-325 MG tablet Take 1-2 tablets by mouth every 6 (six) hours as needed. 09/29/21  Yes Eustace Moore, MD  montelukast (SINGULAIR) 10 MG tablet montelukast 10 mg tablet  Take one tablet (10 mg dose) by mouth at bedtime. 09/02/17  Yes [provider]  omeprazole (PRILOSEC) 40 MG capsule Take by mouth. 02/02/20  Yes [provider]  ondansetron (ZOFRAN-ODT) 4 MG disintegrating tablet ondansetron 4 mg disintegrating tablet 01/01/17  Yes [provider]  predniSONE (DELTASONE) 20 MG tablet Take 2 tablets (40 mg total) by mouth daily with breakfast. 09/29/21  Yes Eustace Moore, MD  rosuvastatin (CRESTOR) 5 MG tablet rosuvastatin 5 mg tablet  TAKE ONE TABLET BY MOUTH EVERY DAY 11/01/13  Yes [provider]  meclizine (ANTIVERT) 25 MG tablet meclizine 25 mg tablet  TAKE ONE  TABLET BY MOUTH 3 TIMES A DAY AS NEEDED FOR DIZZINESS 01/13/18   [provider]  mometasone-formoterol (DULERA) 200-5 MCG/ACT AERO Inhale into the lungs. 08/26/17   [provider]  torsemide (DEMADEX) 20 MG tablet Take by mouth. 04/01/20 11/09/20  [provider]    Family History Family History  Problem Relation Age of Onset   Heart failure Mother    Hypertension Mother    Heart failure Father     Social History Social History   Tobacco Use   Smoking status: Former    Types: Cigarettes    Quit date: 08/02/2012    Years since  quitting: 9.1   Smokeless tobacco: Never  Vaping Use   Vaping Use: Former  Substance Use Topics   Alcohol use: Not Currently   Drug use: Not Currently     Allergies   Amoxicillin-pot clavulanate, Fluocinolone, Nitrofurantoin macrocrystal, Propoxyphene, Ciprofloxacin, Meperidine, Atorvastatin calcium, Doxycycline calcium, and Simvastatin   Review of Systems Review of Systems See HPI  Physical Exam Triage Vital Signs ED Triage Vitals  Enc Vitals Group     BP 09/29/21 1423 (!) 147/82     Pulse Rate 09/29/21 1423 71     Resp 09/29/21 1423 18     Temp 09/29/21 1423 98.3 F (36.8 C)     Temp Source 09/29/21 1423 Oral     SpO2 09/29/21 1423 95 %     Weight 09/29/21 1419 184 lb (83.5 kg)     Height 09/29/21 1419 5' 5.5" (1.664 m)     Head Circumference --      Peak Flow --      Pain Score 09/29/21 1419 7     Pain Loc --      Pain Edu? --      Excl. in GC? --    No data found.  Updated Vital Signs BP (!) 147/82 (BP Location: Left Arm)    Pulse 71    Temp 98.3 F (36.8 C) (Oral)    Resp 18    Ht 5' 5.5" (1.664 m)    Wt 83.5 kg    SpO2 95%    BMI 30.15 kg/m      Physical Exam Constitutional:      General: She is not in acute distress.    Appearance: She is well-developed. She is ill-appearing.  HENT:     Head: Normocephalic and atraumatic.     Mouth/Throat:     Comments: Mask is in place Eyes:     Conjunctiva/sclera: Conjunctivae normal.     Pupils: Pupils are equal, round, and reactive to light.  Cardiovascular:     Rate and Rhythm: Normal rate.  Pulmonary:     Effort: Pulmonary effort is normal. No respiratory distress.  Chest:    Abdominal:     General: There is no distension.     Palpations: Abdomen is soft.  Musculoskeletal:        General: Normal range of motion.     Cervical back: Normal range of motion.  Skin:    General: Skin is warm and dry.  Neurological:     Mental Status: She is alert.     UC Treatments / Results  Labs (all labs ordered  are listed, but only abnormal results are displayed) Labs Reviewed - No data to display  EKG   Radiology DG Ribs Unilateral W/Chest Left  Result Date: 09/29/2021 CLINICAL DATA:  LEFT rib injury, fell onto LEFT side injuring ribs 5 days ago  EXAM: LEFT RIBS AND CHEST - 3+ VIEW COMPARISON:  None FINDINGS: Normal heart size, mediastinal contours, and pulmonary vascularity. Lungs clear. No pulmonary infiltrate, pleural effusion, or pneumothorax. Bones appear demineralized. BBs placed at site of symptoms at lower LEFT ribs. No definite rib fracture or bone destruction. IMPRESSION: No acute abnormalities. Electronically Signed   By: Ulyses Southward M.D.   On: 09/29/2021 14:46    Procedures Procedures (including critical care time)  Medications Ordered in UC Medications - No data to display  Initial Impression / Assessment and Plan / UC Course  I have reviewed the triage vital signs and the nursing notes.  Pertinent labs & imaging results that were available during my care of the patient were reviewed by me and considered in my medical decision making (see chart for details).     Reviewed bruised ribs.  Complications.  Care.  Reasons for return Final Clinical Impressions(s) / UC Diagnoses   Final diagnoses:  Rib contusion, left, initial encounter  Fall at home, initial encounter     Discharge Instructions      Be sure to take deep breaths and hold a pillow against your side to keep from having as much pain with coughing, sneezing, movement Take Tylenol for moderate pain.  May take hydrocodone for severe pain.  Do not drive on hydrocodone I have given you a prescription for prednisone to take for the next few days.  This will take down inflammation and pain in the rib cage See your doctor in follow-up     ED Prescriptions     Medication Sig Dispense Auth. Provider   predniSONE (DELTASONE) 20 MG tablet Take 2 tablets (40 mg total) by mouth daily with breakfast. 10 tablet Eustace Moore, MD   HYDROcodone-acetaminophen (NORCO/VICODIN) 5-325 MG tablet Take 1-2 tablets by mouth every 6 (six) hours as needed. 10 tablet Eustace Moore, MD      I have reviewed the PDMP during this encounter.   Eustace Moore, MD 09/29/21 [phone]

## 2022-01-22 ENCOUNTER — Emergency Department
Admission: EM | Admit: 2022-01-22 | Discharge: 2022-01-22 | Disposition: A | Discharge status: Home / Self Care | Payer: Medicare Other | Source: Home / Self Care

## 2022-01-22 ENCOUNTER — Emergency Department (INDEPENDENT_AMBULATORY_CARE_PROVIDER_SITE_OTHER): Payer: Medicare Other

## 2022-01-22 ENCOUNTER — Encounter: Payer: Self-pay | Admitting: Emergency Medicine

## 2022-01-22 DIAGNOSIS — M25532 Pain in left wrist: Secondary | ICD-10-CM | POA: Diagnosis not present

## 2022-01-22 MED ORDER — HYDROCODONE-ACETAMINOPHEN 5-325 MG PO TABS: 1.0000 | ORAL_TABLET | Freq: Every day | ORAL | 0 refills | Status: AC | PRN

## 2022-01-22 MED ORDER — ACETAMINOPHEN 500 MG PO TABS
1000.0000 mg | ORAL_TABLET | Freq: Once | ORAL | Status: AC
  Administered 2022-01-22: 1000 mg via ORAL

## 2022-01-22 NOTE — Discharge Instructions (Addendum)
Advised/informed patient of left wrist x-ray results with hard copy provided to patient.  Advised patient to RICE left wrist 25 minutes 3 times daily for the next 3 days.  Advised may use OTC Tylenol 1000 mg daily, as needed.  Advised patient may use Vicodin for breakthrough left wrist pain, as needed.  Advised/encouraged patient if left wrist pain worsens and/or unresolved please follow-up with [facility] orthopedic provider for further evaluation.  Contact information for this provider as above.

## 2022-01-22 NOTE — ED Provider Notes (Signed)
[facility]    CSN: [id number] Arrival date & time: 01/22/22  1328      History   Chief Complaint Chief Complaint  Patient presents with   Wrist Pain    HPI Taylor Welch is a 61 y.o. female.   HPI pleasant 61 year old female presents with left wrist pain secondary to left wrist injury playing football last night.  Reports limited range of motion of left wrist since injury.  PMH significant for CAD, HTN, and seizures.  Patient is currently on Plavix and denies any unusual bleeding.  Past Medical History:  Diagnosis Date   Coronary artery disease    Hyperlipemia    Hypertension    Migraines    Seizures (HCC)     There are no problems to display for this patient.   Past Surgical History:  Procedure Laterality Date   BARIATRIC SURGERY      OB History   No obstetric history on file.      Home Medications    Prior to Admission medications   Medication Sig Start Date End Date Taking? Authorizing Provider  amlodipine-atorvastatin (CADUET) 10-10 MG tablet Take 1 tablet by mouth daily.   Yes [provider]  Azilsartan Medoxomil 80 MG TABS Take by mouth.   Yes [provider]  carbamazepine (TEGRETOL XR) 200 MG 12 hr tablet carbamazepine ER 200 mg tablet,extended release,12 hr  Take 3 tablets (600 mg total) by mouth 2 times daily. As directed for seizure control 01/16/19  Yes [provider]  clopidogrel (PLAVIX) 75 MG tablet clopidogrel 75 mg tablet  TAKE ONE TABLET BY MOUTH EVERY DAY 03/28/19  Yes [provider]  cycloSPORINE (RESTASIS) 0.05 % ophthalmic emulsion Restasis 0.05 % eye drops in a dropperette   Yes [provider]  dicyclomine (BENTYL) 10 MG capsule dicyclomine 10 mg capsule   Yes [provider]  DULoxetine (CYMBALTA) 60 MG capsule Take 1 capsule by mouth daily. 09/20/13  Yes [provider]  estradiol (ESTRACE) 0.1 MG/GM vaginal cream estradiol 0.01% (0.1 mg/gram) vaginal cream  10/29/16  Yes [provider]  ezetimibe (ZETIA) 10 MG tablet ezetimibe 10 mg tablet  TAKE 1 TABLET BY MOUTH AFTER BREAKFAST AS DIRECTED 03/17/19  Yes [provider]  fluticasone (FLONASE) 50 MCG/ACT nasal spray fluticasone propionate 50 mcg/actuation nasal spray,suspension 08/02/16  Yes [provider]  HYDROcodone-acetaminophen (NORCO/VICODIN) 5-325 MG tablet Take 1 tablet by mouth daily as needed for moderate pain. 01/22/22  Yes Trevor Iha, FNP  meclizine (ANTIVERT) 25 MG tablet meclizine 25 mg tablet  TAKE ONE TABLET BY MOUTH 3 TIMES A DAY AS NEEDED FOR DIZZINESS 01/13/18  Yes [provider]  mometasone-formoterol (DULERA) 200-5 MCG/ACT AERO Inhale into the lungs. 08/26/17  Yes [provider]  montelukast (SINGULAIR) 10 MG tablet montelukast 10 mg tablet  Take one tablet (10 mg dose) by mouth at bedtime. 09/02/17  Yes [provider]  omeprazole (PRILOSEC) 40 MG capsule Take by mouth. 02/02/20  Yes [provider]  ondansetron (ZOFRAN-ODT) 4 MG disintegrating tablet ondansetron 4 mg disintegrating tablet 01/01/17  Yes [provider]  rosuvastatin (CRESTOR) 5 MG tablet rosuvastatin 5 mg tablet  TAKE ONE TABLET BY MOUTH EVERY DAY 11/01/13  Yes [provider]  torsemide (DEMADEX) 20 MG tablet Take by mouth. 04/01/20 11/09/20  [provider]    Family History Family History  Problem Relation Age of Onset   Heart failure Mother    Hypertension Mother  Heart failure Father     Social History Social History   Tobacco Use   Smoking status: Former    Types: Cigarettes    Quit date: 08/02/2012    Years since quitting: 9.4   Smokeless tobacco: Never  Vaping Use   Vaping Use: Former  Substance Use Topics   Alcohol use: Not Currently   Drug use: Not Currently     Allergies   Amoxicillin-pot clavulanate, Fluocinolone, Nitrofurantoin macrocrystal, Propoxyphene, Ciprofloxacin, Meperidine,  Atorvastatin calcium, Doxycycline calcium, and Simvastatin   Review of Systems Review of Systems  Musculoskeletal:        Left wrist pain x1 day  All other systems reviewed and are negative.    Physical Exam Triage Vital Signs ED Triage Vitals  Enc Vitals Group     BP 01/22/22 1342 (!) 146/80     Pulse Rate 01/22/22 1342 64     Resp 01/22/22 1342 18     Temp 01/22/22 1342 98.5 F (36.9 C)     Temp Source 01/22/22 1342 Oral     SpO2 01/22/22 1342 94 %     Weight 01/22/22 1344 187 lb (84.8 kg)     Height 01/22/22 1344 5' 5.5" (1.664 m)     Head Circumference --      Peak Flow --      Pain Score 01/22/22 1344 7     Pain Loc --      Pain Edu? --      Excl. in GC? --    No data found.  Updated Vital Signs BP (!) 146/80 (BP Location: Right Arm)   Pulse 64   Temp 98.5 F (36.9 C) (Oral)   Resp 18   Ht 5' 5.5" (1.664 m)   Wt 187 lb (84.8 kg)   SpO2 94%   BMI 30.65 kg/m      Physical Exam Vitals and nursing note reviewed.  Constitutional:      General: She is not in acute distress.    Appearance: Normal appearance. She is obese. She is not ill-appearing.  HENT:     Head: Normocephalic and atraumatic.     Mouth/Throat:     Mouth: Mucous membranes are moist.     Pharynx: Oropharynx is clear.  Eyes:     Extraocular Movements: Extraocular movements intact.     Conjunctiva/sclera: Conjunctivae normal.     Pupils: Pupils are equal, round, and reactive to light.  Cardiovascular:     Rate and Rhythm: Normal rate and regular rhythm.     Pulses: Normal pulses.     Heart sounds: Normal heart sounds. No murmur heard. Pulmonary:     Effort: Pulmonary effort is normal.     Breath sounds: Normal breath sounds. No wheezing, rhonchi or rales.  Musculoskeletal:     Cervical back: Normal range of motion and neck supple.     Comments: Left wrist: TTP over distal radius/ulna with mild soft tissue swelling noted, exam limited today due to pain  Skin:    General: Skin is warm  and dry.  Neurological:     General: No focal deficit present.     Mental Status: She is alert and oriented to person, place, and time.      UC Treatments / Results  Labs (all labs ordered are listed, but only abnormal results are displayed) Labs Reviewed - No data to display  EKG   Radiology DG Wrist Complete Left  Result Date: 01/22/2022 CLINICAL DATA:  left wrist pain,  patient fell last night on left wrist and unable to flexor her wrist EXAM: LEFT WRIST - COMPLETE 3+ VIEW COMPARISON:  None Available. FINDINGS: There is no evidence of fracture or dislocation. There is no evidence of arthropathy or other focal bone abnormality. Soft tissues are unremarkable. IMPRESSION: No fracture or subluxation seen. Electronically Signed   By: Marjo Bicker M.D.   On: 01/22/2022 13:59    Procedures Procedures (including critical care time)  Medications Ordered in UC Medications  acetaminophen (TYLENOL) tablet 1,000 mg (1,000 mg Oral Given 01/22/22 1405)    Initial Impression / Assessment and Plan / UC Course  I have reviewed the triage vital signs and the nursing notes.  Pertinent labs & imaging results that were available during my care of the patient were reviewed by me and considered in my medical decision making (see chart for details).     MDM: 1.  Left wrist pain-left wrist x-ray reveals above. Advised/informed patient of left wrist x-ray results with hard copy provided to patient.  Advised patient to RICE left wrist 25 minutes 3 times daily for the next 3 days.  Advised may use OTC Tylenol 1000 mg 1-3 times daily, as needed.  Advised/encouraged patient if left wrist pain worsens and/or unresolved please follow-up with [facility] orthopedic provider for further evaluation.  Contact information for this provider as above.  Patient discharged home, hemodynamically stable. Final Clinical Impressions(s) / UC Diagnoses   Final diagnoses:  Left wrist pain     Discharge Instructions       Advised/informed patient of left wrist x-ray results with hard copy provided to patient.  Advised patient to RICE left wrist 25 minutes 3 times daily for the next 3 days.  Advised may use OTC Tylenol 1000 mg daily, as needed.  Advised patient may use Vicodin for breakthrough left wrist pain, as needed.  Advised/encouraged patient if left wrist pain worsens and/or unresolved please follow-up with [facility] orthopedic provider for further evaluation.  Contact information for this provider as above.     ED Prescriptions     Medication Sig Dispense Auth. Provider   HYDROcodone-acetaminophen (NORCO/VICODIN) 5-325 MG tablet Take 1 tablet by mouth daily as needed for moderate pain. 20 tablet Trevor Iha, FNP      I have reviewed the PDMP during this encounter.   Trevor Iha, FNP 01/22/22 1422

## 2022-03-23 IMAGING — DX DG FOOT COMPLETE 3+V*L*
2 series · 2 of 2 positions shown · non-contrast
Comparison: None.

CLINICAL DATA: Metatarsal pain G, knee injury pain prostate 80

EXAM:
LEFT FOOT - COMPLETE 3+ VIEW

[foot obl]
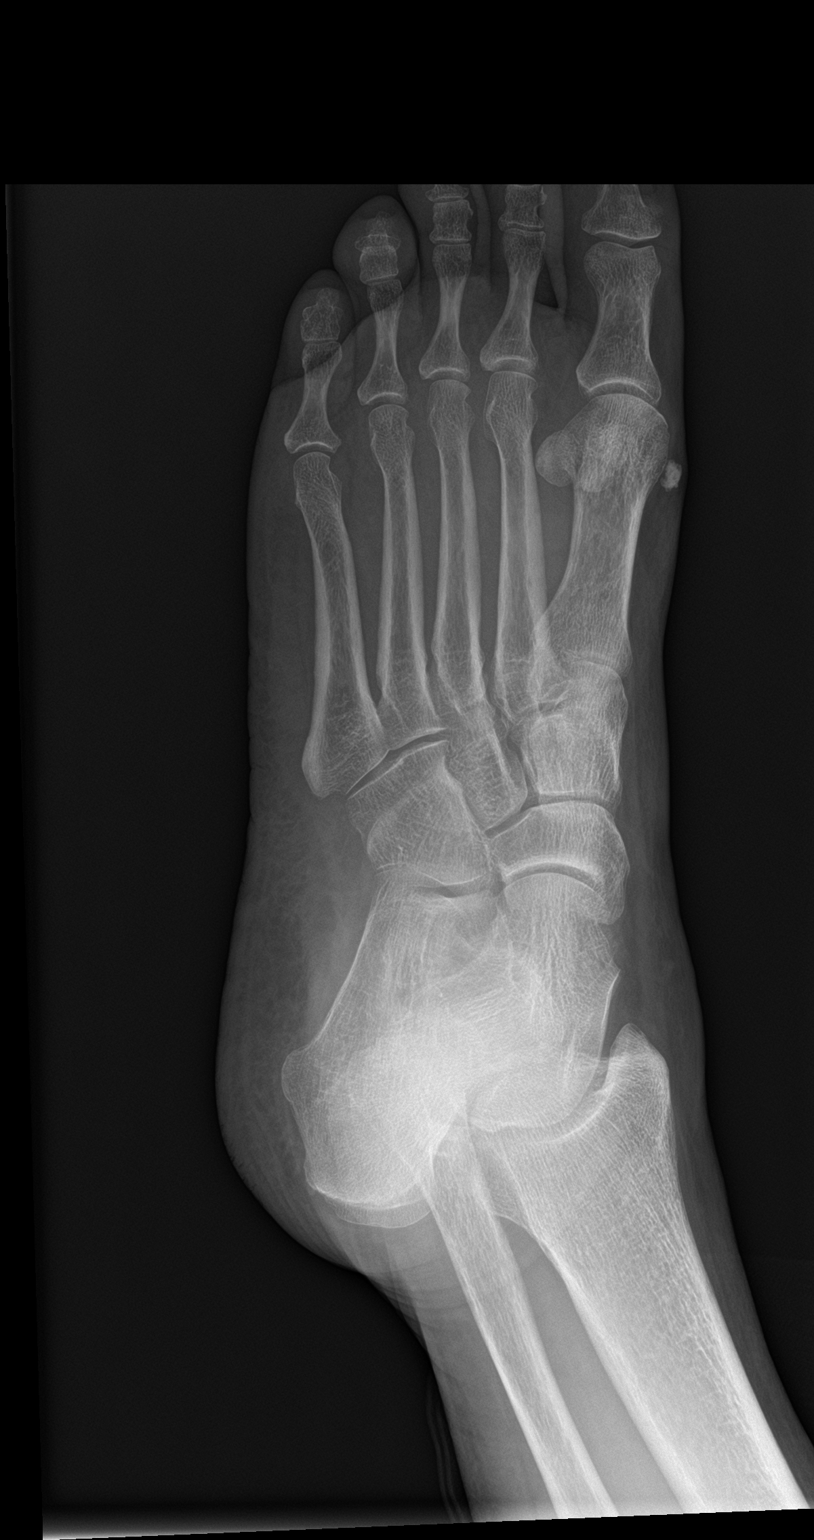

[foot lat]
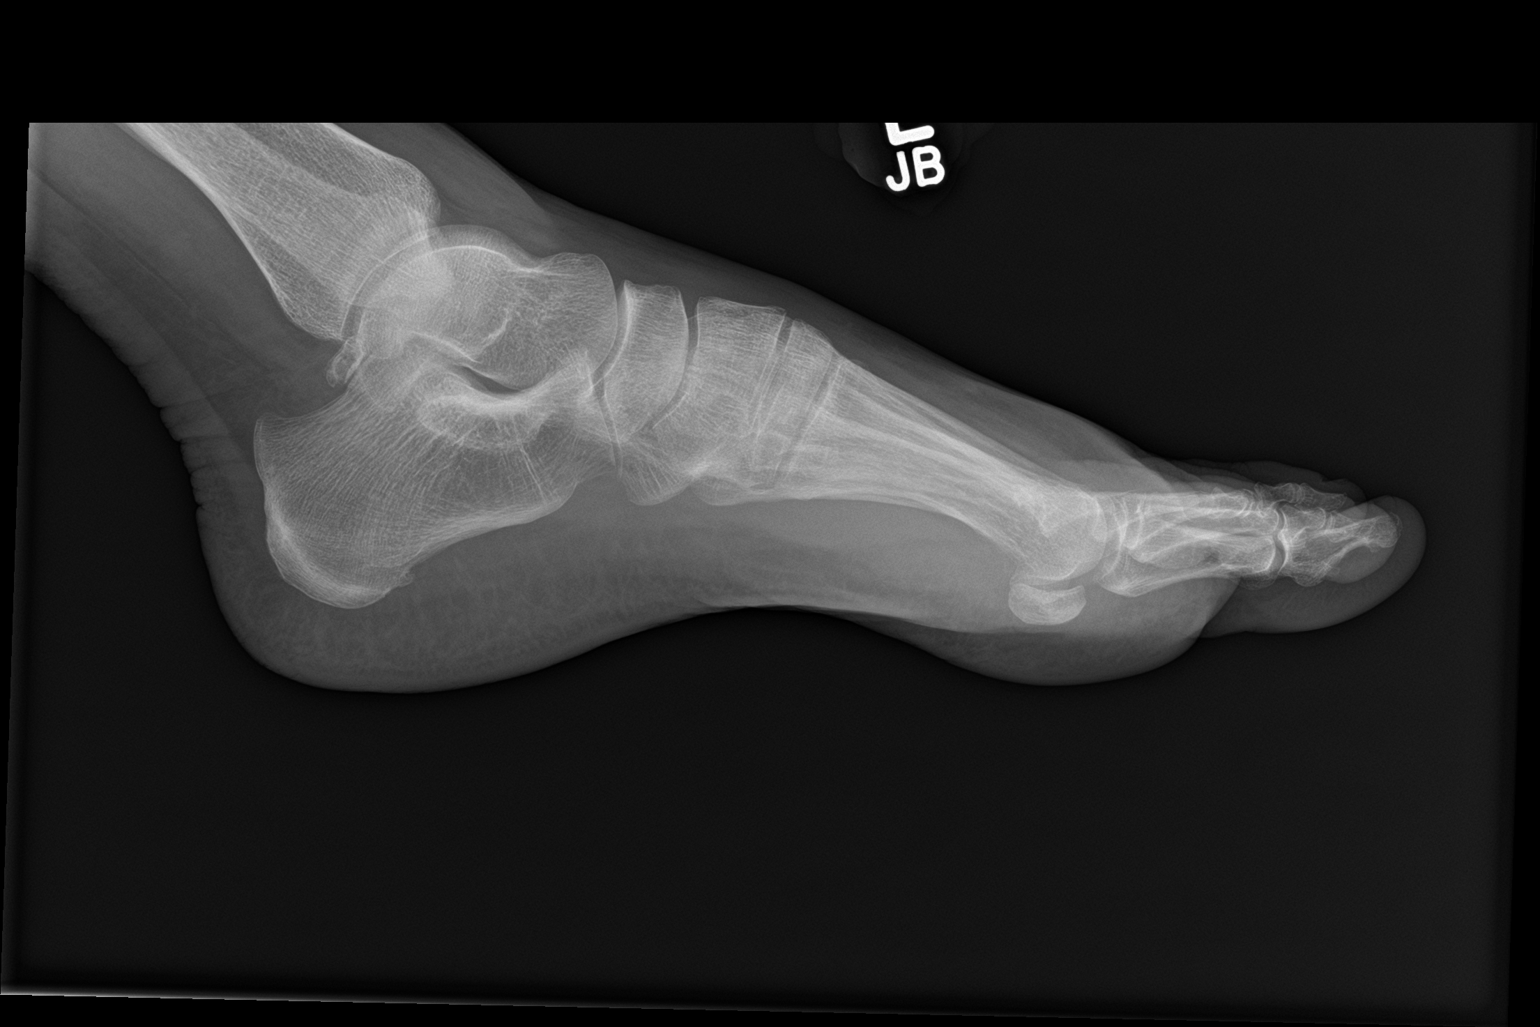

[2 of 2 positions shown; findings below may reference images not displayed]

FINDINGS: There is no evidence of fracture or dislocation. There is no
evidence of arthropathy or other focal bone abnormality. Soft
tissues are unremarkable.
IMPRESSION: No fracture or dislocation of the left foot.
